# Patient Record
Sex: Male | Born: 1985 | Race: Asian | Hispanic: No | Marital: Single | Smoking: Never smoker
Health system: Southern US, Community
[De-identification: ages and names within clinical notes are randomized; demographics above are authoritative.]

## PROBLEM LIST (undated history)

## (undated) DIAGNOSIS — I10 Essential (primary) hypertension: Secondary | ICD-10-CM

---
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
## 2020-11-09 ENCOUNTER — Emergency Department (HOSPITAL_BASED_OUTPATIENT_CLINIC_OR_DEPARTMENT_OTHER): Payer: BC Managed Care – PPO | Admitting: Radiology

## 2020-11-09 ENCOUNTER — Encounter (HOSPITAL_BASED_OUTPATIENT_CLINIC_OR_DEPARTMENT_OTHER): Payer: Self-pay | Admitting: Obstetrics and Gynecology

## 2020-11-09 ENCOUNTER — Emergency Department (HOSPITAL_BASED_OUTPATIENT_CLINIC_OR_DEPARTMENT_OTHER)
Admission: EM | Admit: 2020-11-09 | Discharge: 2020-11-09 | Disposition: A | Discharge status: Home / Self Care | Payer: BC Managed Care – PPO | Attending: Emergency Medicine | Admitting: Emergency Medicine

## 2020-11-09 ENCOUNTER — Other Ambulatory Visit: Payer: Self-pay

## 2020-11-09 DIAGNOSIS — Y9355 Activity, bike riding: Secondary | ICD-10-CM | POA: Diagnosis not present

## 2020-11-09 DIAGNOSIS — Z23 Encounter for immunization: Secondary | ICD-10-CM | POA: Insufficient documentation

## 2020-11-09 DIAGNOSIS — I10 Essential (primary) hypertension: Secondary | ICD-10-CM | POA: Insufficient documentation

## 2020-11-09 DIAGNOSIS — S51812A Laceration without foreign body of left forearm, initial encounter: Secondary | ICD-10-CM

## 2020-11-09 HISTORY — DX: Essential (primary) hypertension: I10

## 2020-11-09 MED ORDER — LIDOCAINE-EPINEPHRINE (PF) 2 %-1:200000 IJ SOLN
10.0000 mL | Freq: Once | INTRAMUSCULAR | Status: AC
  Administered 2020-11-09: 10 mL
  Filled 2020-11-09: qty 20

## 2020-11-09 MED ORDER — TETANUS-DIPHTH-ACELL PERTUSSIS 5-2.5-18.5 LF-MCG/0.5 IM SUSY
0.5000 mL | PREFILLED_SYRINGE | Freq: Once | INTRAMUSCULAR | Status: AC
  Administered 2020-11-09: 0.5 mL via INTRAMUSCULAR
  Filled 2020-11-09: qty 0.5
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
## 2020-11-09 NOTE — Discharge Instructions (Signed)
Keep wound clean and dry.  You may clean your wound with Dove soap with a Q-tip if needed.  Recommend wound recheck with your doctor in 2 days, suture removal in 7 to 10 days.

## 2020-11-09 NOTE — ED Notes (Signed)
Pt d/c home per MD order. Discharge summary reviewed with pt, pt verbalizes understanding. No s/s of acute distress noted at discharge. Ambulatory off unit. Discharged home with visitor.  

## 2020-11-19 ENCOUNTER — Other Ambulatory Visit: Payer: Self-pay

## 2020-11-19 ENCOUNTER — Ambulatory Visit
Admission: RE | Admit: 2020-11-19 | Discharge: 2020-11-19 | Disposition: A | Discharge status: Home / Self Care | Payer: BC Managed Care – PPO | Source: Ambulatory Visit

## 2020-11-19 DIAGNOSIS — Z4802 Encounter for removal of sutures: Secondary | ICD-10-CM

## 2020-11-19 NOTE — ED Notes (Signed)
Six sutures removed with no issues. Patient educated on s/s of infection.

## 2020-11-19 NOTE — ED Triage Notes (Signed)
Pt presents here today for suture removal (6 sutures). Pt denies having any issues to the site.

## 2020-11-19 NOTE — Discharge Instructions (Signed)
Monitor for signs of infection (tenderness, redness, drainage). If you have any of tenderness please return for evaluation.

## 2021-03-13 DIAGNOSIS — J302 Other seasonal allergic rhinitis: Secondary | ICD-10-CM | POA: Diagnosis not present

## 2021-03-13 DIAGNOSIS — R030 Elevated blood-pressure reading, without diagnosis of hypertension: Secondary | ICD-10-CM | POA: Diagnosis not present

## 2021-03-13 DIAGNOSIS — E669 Obesity, unspecified: Secondary | ICD-10-CM | POA: Diagnosis not present
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
## 2023-06-15 ENCOUNTER — Ambulatory Visit: Admitting: Family Medicine

## 2023-11-08 DIAGNOSIS — R0683 Snoring: Secondary | ICD-10-CM | POA: Diagnosis not present

## 2023-12-12 DIAGNOSIS — G4733 Obstructive sleep apnea (adult) (pediatric): Secondary | ICD-10-CM | POA: Diagnosis not present

## 2023-12-20 DIAGNOSIS — G4733 Obstructive sleep apnea (adult) (pediatric): Secondary | ICD-10-CM | POA: Diagnosis not present

## 2023-12-22 DIAGNOSIS — G4733 Obstructive sleep apnea (adult) (pediatric): Secondary | ICD-10-CM | POA: Diagnosis not present
